# Patient Record
Sex: Female | Born: 1935 | Race: White | Hispanic: No | Smoking: Never smoker
Health system: Southern US, Community
[De-identification: ages and names within clinical notes are randomized; demographics above are authoritative.]

## PROBLEM LIST (undated history)

## (undated) ENCOUNTER — Emergency Department (HOSPITAL_COMMUNITY): Disposition: A | Discharge status: Home / Self Care | Payer: Medicare PPO

## (undated) DIAGNOSIS — M1990 Unspecified osteoarthritis, unspecified site: Secondary | ICD-10-CM

## (undated) DIAGNOSIS — F0280 Dementia in other diseases classified elsewhere without behavioral disturbance: Secondary | ICD-10-CM

## (undated) DIAGNOSIS — E039 Hypothyroidism, unspecified: Secondary | ICD-10-CM

## (undated) DIAGNOSIS — G309 Alzheimer's disease, unspecified: Secondary | ICD-10-CM

---

## 2000-06-08 ENCOUNTER — Encounter (INDEPENDENT_AMBULATORY_CARE_PROVIDER_SITE_OTHER): Payer: Self-pay | Admitting: Specialist

## 2000-06-08 ENCOUNTER — Other Ambulatory Visit: Admission: RE | Admit: 2000-06-08 | Discharge: 2000-06-08 | Payer: Self-pay | Admitting: *Deleted

## 2000-07-31 ENCOUNTER — Ambulatory Visit (HOSPITAL_COMMUNITY): Admission: RE | Admit: 2000-07-31 | Discharge: 2000-07-31 | Payer: Self-pay

## 2000-07-31 ENCOUNTER — Encounter (INDEPENDENT_AMBULATORY_CARE_PROVIDER_SITE_OTHER): Payer: Self-pay | Admitting: Specialist

## 2000-12-13 ENCOUNTER — Encounter: Payer: Self-pay | Admitting: *Deleted

## 2000-12-13 ENCOUNTER — Encounter (INDEPENDENT_AMBULATORY_CARE_PROVIDER_SITE_OTHER): Payer: Self-pay | Admitting: Specialist

## 2000-12-13 ENCOUNTER — Ambulatory Visit (HOSPITAL_COMMUNITY): Admission: RE | Admit: 2000-12-13 | Discharge: 2000-12-13 | Payer: Self-pay | Admitting: *Deleted

## 2001-03-06 ENCOUNTER — Encounter (INDEPENDENT_AMBULATORY_CARE_PROVIDER_SITE_OTHER): Payer: Self-pay | Admitting: Specialist

## 2001-03-06 ENCOUNTER — Ambulatory Visit: Admission: RE | Admit: 2001-03-06 | Discharge: 2001-03-06 | Payer: Self-pay | Admitting: Gynecology

## 2001-03-06 ENCOUNTER — Other Ambulatory Visit: Admission: RE | Admit: 2001-03-06 | Discharge: 2001-03-06 | Payer: Self-pay | Admitting: Gynecology

## 2001-03-19 ENCOUNTER — Encounter: Payer: Self-pay | Admitting: Gynecology

## 2001-03-21 ENCOUNTER — Inpatient Hospital Stay (HOSPITAL_COMMUNITY): Admission: RE | Admit: 2001-03-21 | Discharge: 2001-03-24 | Payer: Self-pay | Admitting: Gynecology

## 2001-03-21 ENCOUNTER — Encounter (INDEPENDENT_AMBULATORY_CARE_PROVIDER_SITE_OTHER): Payer: Self-pay | Admitting: Specialist

## 2001-03-28 ENCOUNTER — Ambulatory Visit: Admission: RE | Admit: 2001-03-28 | Discharge: 2001-03-28 | Payer: Self-pay | Admitting: Gynecology

## 2001-06-27 ENCOUNTER — Encounter (INDEPENDENT_AMBULATORY_CARE_PROVIDER_SITE_OTHER): Payer: Self-pay | Admitting: Specialist

## 2001-06-27 ENCOUNTER — Ambulatory Visit: Admission: RE | Admit: 2001-06-27 | Discharge: 2001-06-27 | Payer: Self-pay | Admitting: Gynecology

## 2001-06-27 ENCOUNTER — Other Ambulatory Visit: Admission: RE | Admit: 2001-06-27 | Discharge: 2001-06-27 | Payer: Self-pay | Admitting: Gynecology

## 2001-10-18 ENCOUNTER — Other Ambulatory Visit: Admission: RE | Admit: 2001-10-18 | Discharge: 2001-10-18 | Payer: Self-pay | Admitting: Obstetrics and Gynecology

## 2001-12-19 ENCOUNTER — Other Ambulatory Visit: Admission: RE | Admit: 2001-12-19 | Discharge: 2001-12-19 | Payer: Self-pay | Admitting: Gynecology

## 2001-12-19 ENCOUNTER — Ambulatory Visit: Admission: RE | Admit: 2001-12-19 | Discharge: 2001-12-19 | Payer: Self-pay | Admitting: Gynecology

## 2001-12-19 ENCOUNTER — Encounter (INDEPENDENT_AMBULATORY_CARE_PROVIDER_SITE_OTHER): Payer: Self-pay | Admitting: Specialist

## 2002-01-15 ENCOUNTER — Ambulatory Visit (HOSPITAL_COMMUNITY): Admission: RE | Admit: 2002-01-15 | Discharge: 2002-01-15 | Payer: Self-pay | Admitting: Gynecology

## 2002-01-15 ENCOUNTER — Encounter (INDEPENDENT_AMBULATORY_CARE_PROVIDER_SITE_OTHER): Payer: Self-pay | Admitting: *Deleted

## 2002-03-26 ENCOUNTER — Encounter (INDEPENDENT_AMBULATORY_CARE_PROVIDER_SITE_OTHER): Payer: Self-pay | Admitting: *Deleted

## 2002-03-26 ENCOUNTER — Ambulatory Visit: Admission: RE | Admit: 2002-03-26 | Discharge: 2002-03-26 | Payer: Self-pay | Admitting: Gynecology

## 2002-03-26 ENCOUNTER — Other Ambulatory Visit: Admission: RE | Admit: 2002-03-26 | Discharge: 2002-03-26 | Payer: Self-pay | Admitting: Gynecology

## 2002-06-26 ENCOUNTER — Ambulatory Visit: Admission: RE | Admit: 2002-06-26 | Discharge: 2002-06-26 | Payer: Self-pay | Admitting: Gynecology

## 2002-06-26 ENCOUNTER — Encounter (INDEPENDENT_AMBULATORY_CARE_PROVIDER_SITE_OTHER): Payer: Self-pay | Admitting: Specialist

## 2002-06-26 ENCOUNTER — Other Ambulatory Visit: Admission: RE | Admit: 2002-06-26 | Discharge: 2002-06-26 | Payer: Self-pay | Admitting: Gynecology

## 2003-08-05 ENCOUNTER — Ambulatory Visit: Admission: RE | Admit: 2003-08-05 | Discharge: 2003-08-05 | Payer: Self-pay | Admitting: Gynecology

## 2003-08-05 ENCOUNTER — Encounter (INDEPENDENT_AMBULATORY_CARE_PROVIDER_SITE_OTHER): Payer: Self-pay | Admitting: *Deleted

## 2003-08-05 ENCOUNTER — Other Ambulatory Visit: Admission: RE | Admit: 2003-08-05 | Discharge: 2003-08-05 | Payer: Self-pay | Admitting: Gynecology

## 2003-08-26 ENCOUNTER — Encounter: Payer: Self-pay | Admitting: Gynecology

## 2003-08-26 ENCOUNTER — Ambulatory Visit: Admission: RE | Admit: 2003-08-26 | Discharge: 2003-08-26 | Payer: Self-pay | Admitting: Gynecology

## 2003-08-26 ENCOUNTER — Encounter (INDEPENDENT_AMBULATORY_CARE_PROVIDER_SITE_OTHER): Payer: Self-pay | Admitting: Specialist

## 2003-09-11 ENCOUNTER — Ambulatory Visit (HOSPITAL_COMMUNITY): Admission: RE | Admit: 2003-09-11 | Discharge: 2003-09-11 | Payer: Self-pay | Admitting: Gynecology

## 2003-09-16 ENCOUNTER — Ambulatory Visit: Admission: RE | Admit: 2003-09-16 | Discharge: 2003-12-18 | Payer: Self-pay | Admitting: Radiation Oncology

## 2003-11-21 ENCOUNTER — Ambulatory Visit (HOSPITAL_COMMUNITY): Admission: RE | Admit: 2003-11-21 | Discharge: 2003-11-21 | Payer: Self-pay | Admitting: Gynecology

## 2003-11-26 ENCOUNTER — Ambulatory Visit (HOSPITAL_COMMUNITY): Admission: RE | Admit: 2003-11-26 | Discharge: 2003-11-26 | Payer: Self-pay | Admitting: Gynecology

## 2003-12-03 ENCOUNTER — Ambulatory Visit (HOSPITAL_COMMUNITY): Admission: RE | Admit: 2003-12-03 | Discharge: 2003-12-03 | Payer: Self-pay | Admitting: Gynecology

## 2003-12-12 ENCOUNTER — Ambulatory Visit (HOSPITAL_COMMUNITY): Admission: RE | Admit: 2003-12-12 | Discharge: 2003-12-12 | Payer: Self-pay | Admitting: Gynecology

## 2003-12-17 ENCOUNTER — Ambulatory Visit (HOSPITAL_COMMUNITY): Admission: RE | Admit: 2003-12-17 | Discharge: 2003-12-17 | Payer: Self-pay | Admitting: Gynecology

## 2004-01-07 ENCOUNTER — Encounter: Admission: RE | Admit: 2004-01-07 | Discharge: 2004-01-07 | Payer: Self-pay | Admitting: Geriatric Medicine

## 2004-01-20 ENCOUNTER — Ambulatory Visit: Admission: RE | Admit: 2004-01-20 | Discharge: 2004-01-20 | Payer: Self-pay | Admitting: Radiation Oncology

## 2004-02-03 ENCOUNTER — Ambulatory Visit: Admission: RE | Admit: 2004-02-03 | Discharge: 2004-02-03 | Payer: Self-pay | Admitting: Radiation Oncology

## 2004-02-10 ENCOUNTER — Ambulatory Visit: Admission: RE | Admit: 2004-02-10 | Discharge: 2004-02-10 | Payer: Self-pay | Admitting: Radiation Oncology

## 2004-04-27 ENCOUNTER — Encounter (INDEPENDENT_AMBULATORY_CARE_PROVIDER_SITE_OTHER): Payer: Self-pay | Admitting: *Deleted

## 2004-04-27 ENCOUNTER — Other Ambulatory Visit: Admission: RE | Admit: 2004-04-27 | Discharge: 2004-04-27 | Payer: Self-pay | Admitting: Gynecology

## 2004-04-28 ENCOUNTER — Ambulatory Visit: Admission: RE | Admit: 2004-04-28 | Discharge: 2004-04-28 | Payer: Self-pay | Admitting: Gynecology

## 2004-08-10 ENCOUNTER — Other Ambulatory Visit: Admission: RE | Admit: 2004-08-10 | Discharge: 2004-08-10 | Payer: Self-pay | Admitting: Radiation Oncology

## 2004-08-10 ENCOUNTER — Ambulatory Visit: Admission: RE | Admit: 2004-08-10 | Discharge: 2004-08-10 | Payer: Self-pay | Admitting: Radiation Oncology

## 2008-03-28 ENCOUNTER — Other Ambulatory Visit: Admission: RE | Admit: 2008-03-28 | Discharge: 2008-03-28 | Payer: Self-pay | Admitting: Geriatric Medicine

## 2008-06-25 ENCOUNTER — Emergency Department (HOSPITAL_COMMUNITY): Admission: EM | Admit: 2008-06-25 | Discharge: 2008-06-25 | Payer: Self-pay | Admitting: Emergency Medicine

## 2008-08-21 ENCOUNTER — Emergency Department (HOSPITAL_COMMUNITY): Admission: EM | Admit: 2008-08-21 | Discharge: 2008-08-21 | Payer: Self-pay | Admitting: Emergency Medicine

## 2008-08-22 ENCOUNTER — Emergency Department (HOSPITAL_COMMUNITY): Admission: EM | Admit: 2008-08-22 | Discharge: 2008-08-22 | Payer: Self-pay | Admitting: Emergency Medicine

## 2010-01-03 ENCOUNTER — Emergency Department (HOSPITAL_COMMUNITY): Admission: EM | Admit: 2010-01-03 | Discharge: 2010-01-03 | Payer: Self-pay | Admitting: Emergency Medicine

## 2010-06-20 IMAGING — CT CT HEAD W/O CM
1 series · 16 of 30 positions shown, 20 images · non-contrast
Comparison: MRI 01/07/2004.  No priors CT

CLINICAL DATA: Fell - trauma to right forehead

CT HEAD WITHOUT CONTRAST
TECHNIQUE: Contiguous axial images were obtained from the base of
the skull through the vertex without contrast.

[Series 2: headseq 4.8 h45s · axial · 0.43mm/px · z∈[-146,+6]mm · 16 of 36 slices shown, 20 images]
[im 2/36  brain]
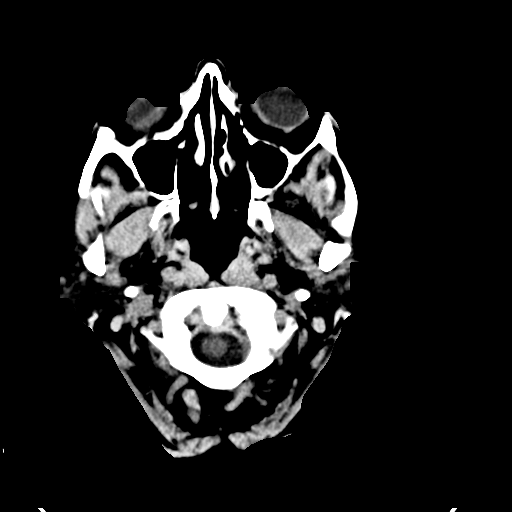
[im 2/36  bone]
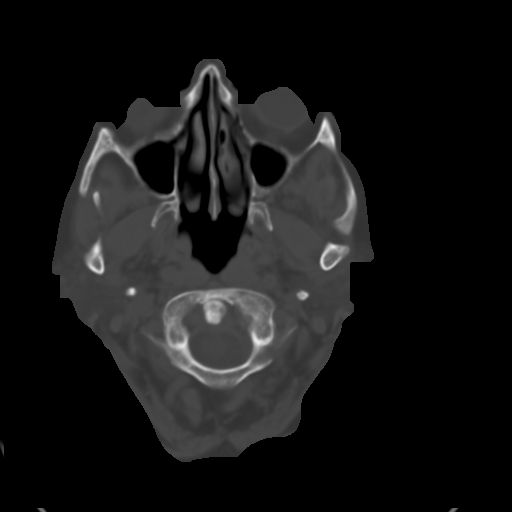
[im 4/36  brain]
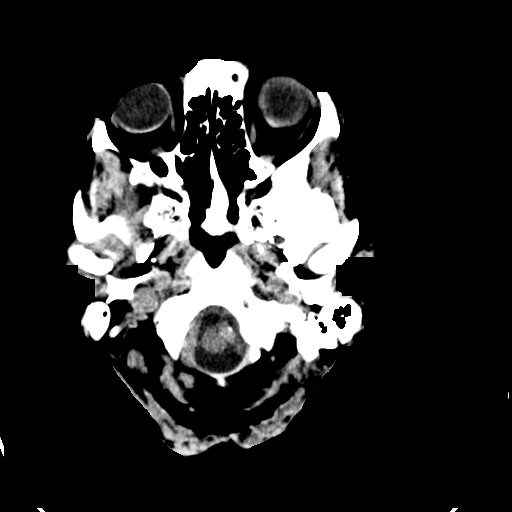
[im 7/36  brain]
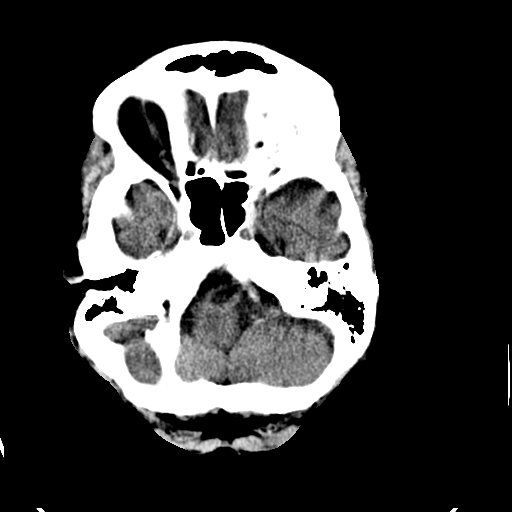
[im 9/36  brain]
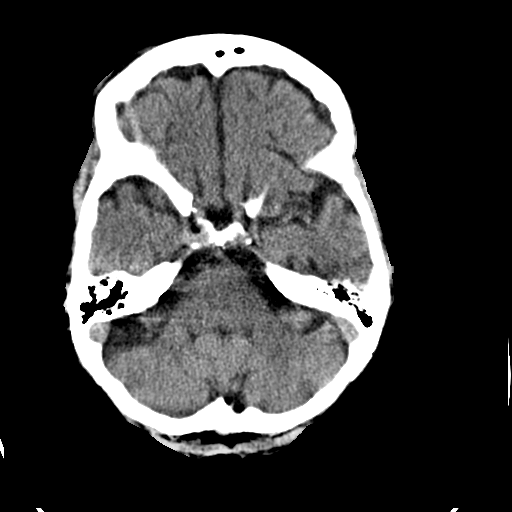
[im 10/36  brain]
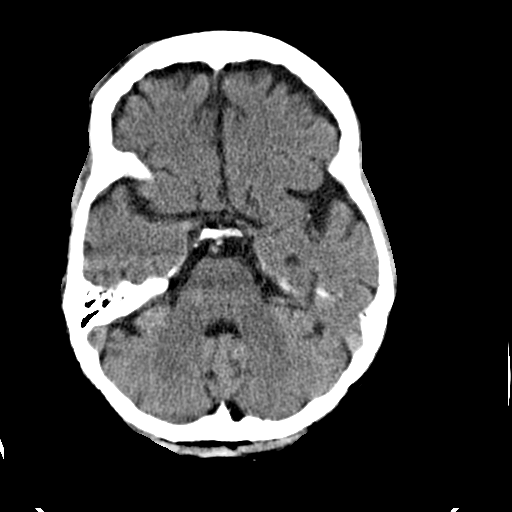
[im 10/36  bone]
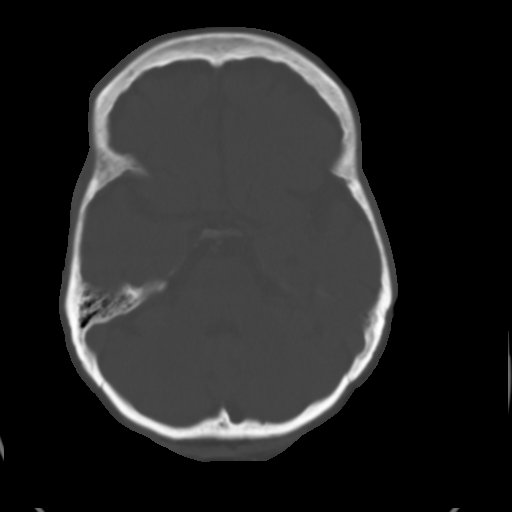
[im 13/36  brain]
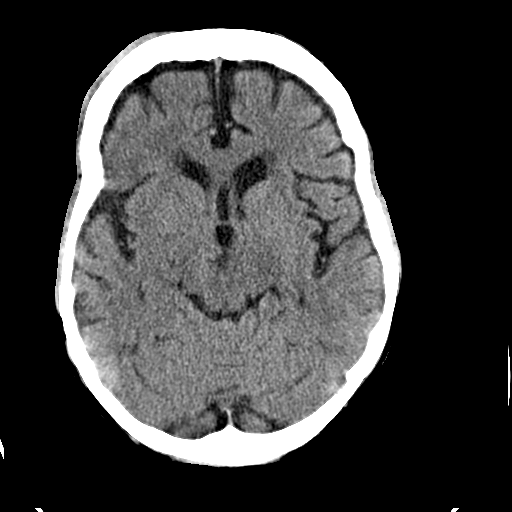
[im 15/36  brain]
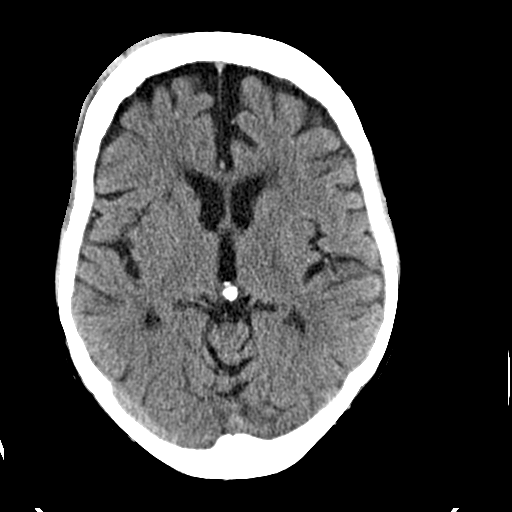
[im 17/36  brain]
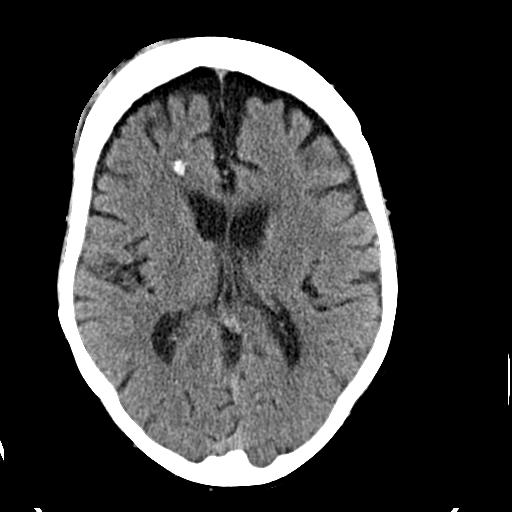
[im 19/36  brain]
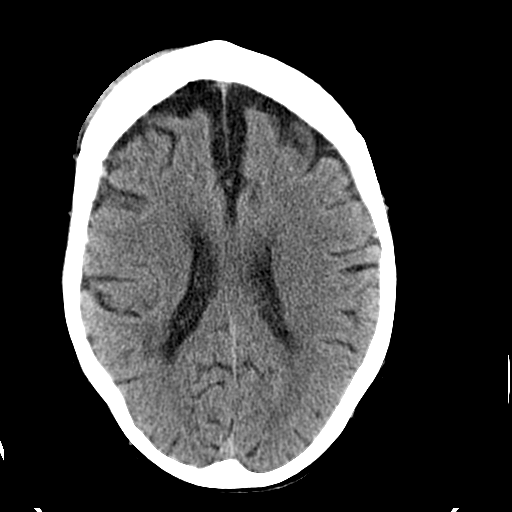
[im 19/36  bone]
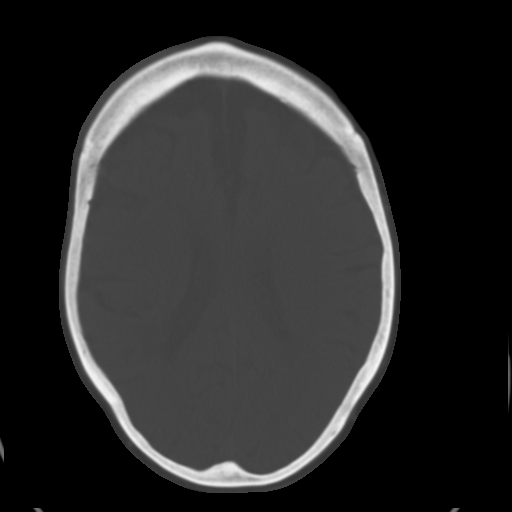
[im 21/36  brain]
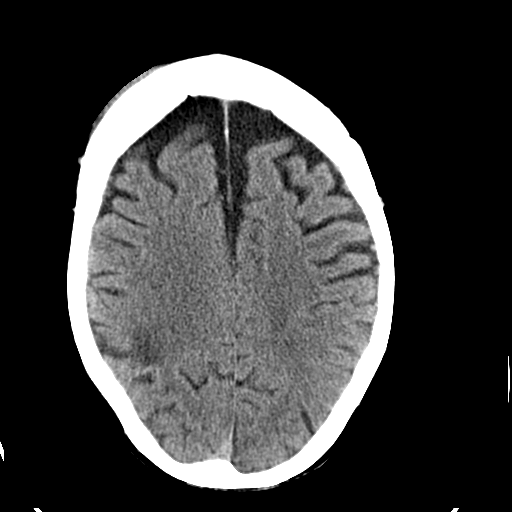
[im 23/36  brain]
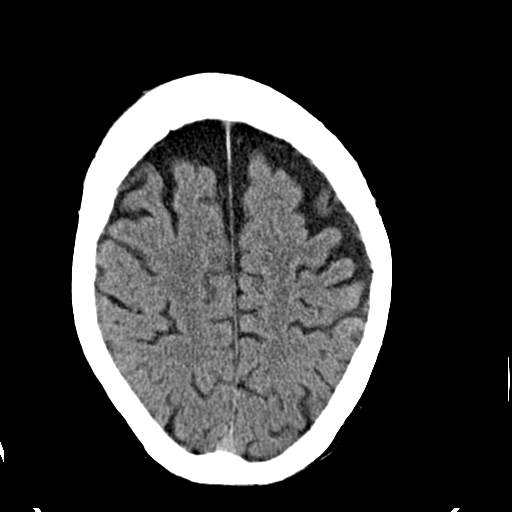
[im 26/36  brain]
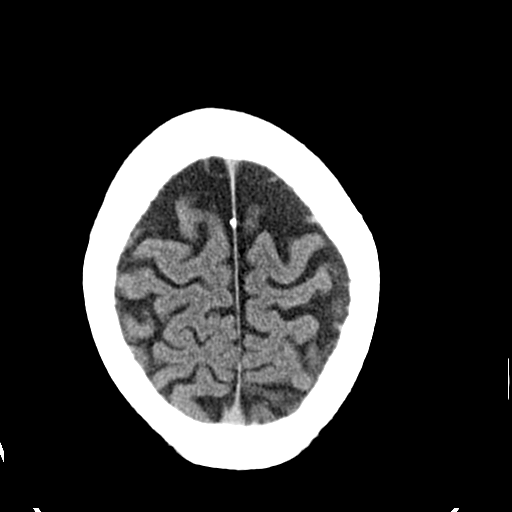
[im 27/36  brain]
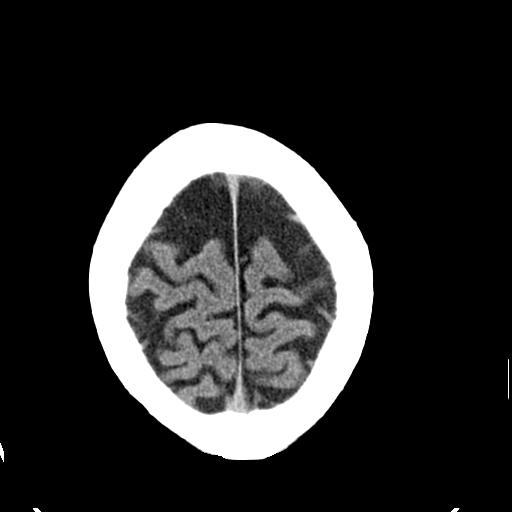
[im 27/36  bone]
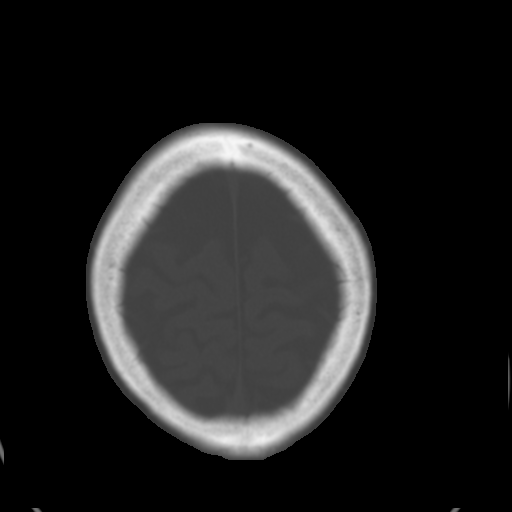
[im 29/36  brain]
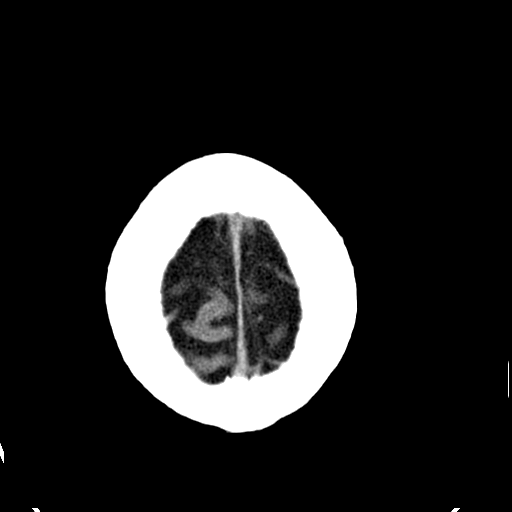
[im 32/36  brain]
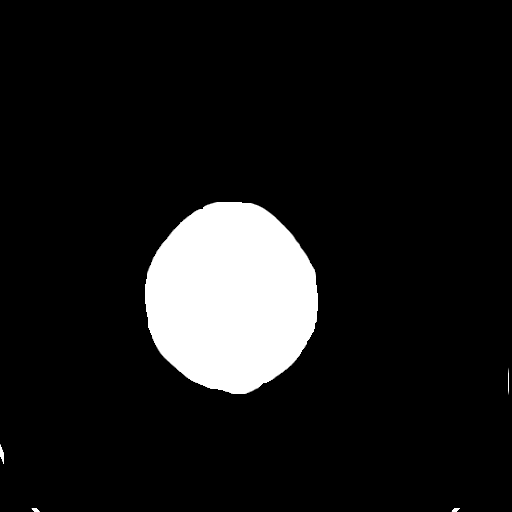
[im 34/36  brain]
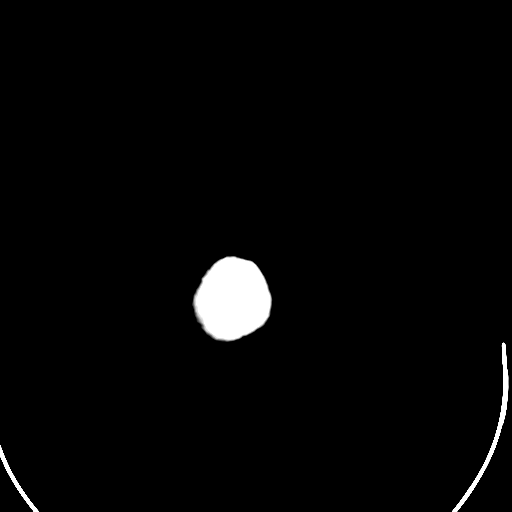

[16 of 30 positions shown; findings below may reference images not displayed]

FINDINGS: There is generalized atrophy.  In the white matter
surrounding the frontal horn of the right lateral ventricle, there
is a 6 mm high density focus with Hounsfield units in the 80s,
consistent with blood density.  This is consistent with a
hemorrhagic contusion.

There is a sub galeal hematoma in the right frontal scalp, with the
same Hounsfield unit measurement.  There is no edema or mass
effect.  No extra-axial fluid collections.

Calvarium intact.  No fluid in the sinuses visualized.
IMPRESSION: 1.  There is a small hemorrhagic contusion in the white matter
around the frontal horn of the right lateral ventricle. At this
time there is no edema or mass effect.
2.  Prominent sub galeal hematoma in the right frontal scalp.
3.  Generalized atrophy.

## 2010-08-19 ENCOUNTER — Emergency Department (HOSPITAL_COMMUNITY): Admission: EM | Admit: 2010-08-19 | Discharge: 2010-08-04 | Payer: Self-pay | Admitting: Emergency Medicine

## 2010-10-02 ENCOUNTER — Encounter: Payer: Self-pay | Admitting: Radiation Oncology

## 2010-11-23 LAB — URINALYSIS, ROUTINE W REFLEX MICROSCOPIC
Ketones, ur: NEGATIVE mg/dL
Nitrite: NEGATIVE
Specific Gravity, Urine: 1.018 (ref 1.005–1.030)
pH: 7 (ref 5.0–8.0)

## 2010-11-30 LAB — URINALYSIS, ROUTINE W REFLEX MICROSCOPIC
Bilirubin Urine: NEGATIVE
Nitrite: NEGATIVE
Specific Gravity, Urine: 1.013 (ref 1.005–1.030)
Urobilinogen, UA: 0.2 mg/dL (ref 0.0–1.0)

## 2011-01-28 NOTE — Consult Note (Signed)
[facility]  Patient:    [name], [name] [facility] Visit Number: [id number] MRN: [id number]          Service Type: GON Location: GYN Attending Physician:  Jeannette Corpus Dictated by:   Rande Brunt. Clarke-Pearson, M.D. Proc. Date: 06/27/01 Admit Date:  06/27/2001   CC:         Lafonda Mosses B. Thomasena Edis, M.D.             Hal T. Stoneking, M.D.             Telford Nab, R.N.                          Consultation Report  REASON FOR CONSULTATION:  [age]-year-old white female returns for continuing followup of a stage IB, grade 3 endometrial adenocarcinoma.  She underwent initial surgery in July of 2001.  Since her last visit, the patient has done well.  She denies any GI or GU symptoms, has no pelvic pain, pressure, vaginal bleeding or discharge.  Her main complaint is that of arthritis in her knees which limits her activity.  REVIEW OF SYSTEMS:  Review of systems reveals no cardiovascular, pulmonary, GI or GU symptoms.  FAMILY HISTORY AND SOCIAL HISTORY:  Reviewed and unchanged.  PHYSICAL EXAMINATION:  VITAL SIGNS:  Weight 165 pounds (stable).  Blood pressure 134/84.  GENERAL:  The patient is a healthy white female who appears younger than stated age.  HEENT:  Negative.  NECK:  Supple without thyromegaly.  NODES:  There is no supraclavicular or inguinal adenopathy.  ABDOMEN:  Soft and nontender.  No masses, organomegaly, ascites or herniae are noted.  Her midline incision is well-healed.  PELVIC:  EGBUS normal.  Vagina is clean and well-supported.  The vaginal cuff is normal.  There are no lesions noted.  Bimanual and rectovaginal exam reveal no masses, induration or nodularity.  EXTREMITIES:  The lower extremities have bilateral 1+ edema in the ankles.  IMPRESSION:  Stage IB, grade 3 endometrial adenocarcinoma.  PLAN:  Pap smears were obtained.  The patient will return to see Dr. Lafonda Mosses B. Collins in three months and return to see  [place] in six months. Dictated by:   Rande Brunt. Clarke-Pearson, M.D. Attending Physician:  Jeannette Corpus DD:  06/27/01 TD:  06/28/01 Job: [id number]

## 2011-01-28 NOTE — Op Note (Signed)
[facility]  Patient:    [name], [name]                  MRN: [id number] Proc. Date: 03/21/01 Adm. Date:  [id number] Disc. Date: [id number] Attending:  Jeannette Corpus CC:         Beather Arbour. Thomasena Edis, M.D.  Telford Nab, R.N.   Operative Report  PREOPERATIVE DIAGNOSIS:  Endometrial carcinoma.  POSTOPERATIVE DIAGNOSIS:  Endometrial carcinoma.  OPERATION: 1. Exploratory laparotomy. 2. Total abdominal hysterectomy. 3. Left salpingo-oophorectomy. 4. Pelvic lymphadenectomy. 5. Lysis of adhesions.  SURGEONS:  Daniel L. Clarke-Pearson, M.D.  ASSISTANT:  1. Beather Arbour Thomasena Edis, M.D.             2. Telford Nab, R.N.  ANESTHESIA:  General with orotracheal tube.  ESTIMATED BLOOD LOSS: 300 cc.  SURGICAL FINDINGS:  At exploratory laparotomy, there were dense adhesions of the omentum to the anterior abdominal wall underlying the prior midline incision.  Small bowel was adherent to the uterine fundus.  The uterus was slightly enlarged.  The left tube and ovary were normal.  The right tube and ovary were surgically absent.  Exploration of the upper abdomen was normal. There were no enlarged periaortic lymph nodes.  There was one enlarged right obturator lymph node which, on frozen section, returned as negative.  Frozen section revealed the uterus to contain a cancer which was invading the myometrial wall less than half way.  DESCRIPTION OF PROCEDURE:  The patient was brought to the operating room, and after satisfactory attainment of general anesthesia, was placed in the modified lithotomy position in Montross stirrups.  The anterior abdominal, perineum, and vagina were prepped with Betadine, Foley catheter was placed, and the patient was draped.  The abdomen was opened through a prior midline incision, the old scar being excised.  Peritoneal washings were obtained. Adhesions of the omentum to the anterior abdominal wall were lysed with  sharp and blunt dissection.  Hemostasis of the omentum was achieved with cautery.  A Buchwalter retractor was positioned, and the abdomen and pelvis explored with the above-noted findings.  Small-bowel adhesions were lysed from the uterus using sharp dissection.  The bowel was packed out of the pelvis.  The uterus was grasped with large Kelly clamps, and the retroperitoneal spaces were opened after dividing the round ligaments.  The ovarian vessels were skeletonized, clamped, cut, free tied, and suture ligated.  The paravesical and perirectal spaces were opened.  The bladder flap was advanced through sharp and blunt dissection.  The uterine vessels were skeletonized, clamped, cut, and suture ligated.  The rectovaginal septum was opened, and the remainder of the paracervical and paravaginal tissues were clamped, cut and suture ligated.  The vaginal angles cross clamped and the vagina transected from its junction with the cervix.   The uterus was submitted to frozen section with the above-noted findings.  The vaginal angles were transfixed using 0 Vicryl and the central portion of the vagina closed with interrupted figure-of-eight sutures of 0 Vicryl.  The palpable right obturator node was discovered.  Right pelvic lymphadenectomy of the external artery and vein and obturator space was performed.  It was noted that all of her retroperitoneal tissues were very friable, bled easily on contact.  In addition, the retroperitoneal arteries were very torturous.   A similar procedure was performed on the left side of the pelvis.  At that juncture, frozen section returned indicating that the enlarged lymph node was negative.  The pelvis was then  irrigated and inspected for hemostasis.  Packs and retractors were removed, and the anterior abdominal wall closed in layers, the first being a running Smead-Jones closure using #1 PDS.  Subcutaneous tissue was irrigated.  Hemostasis was achieved  with cautery, and the skin was closed with skin staples.  Dressing was applied. The patient was awakened from anesthesia and taken to the recovery room in satisfactory condition.  Sponge, needle, and instrument counts were correct x 2.    DD:  03/21/01 TD:  03/21/01 Job: [id number]

## 2011-01-28 NOTE — Op Note (Signed)
[facility]  Patient:    [name], [name]                  MRN: [id number] Proc. Date: 12/13/00 Adm. Date:  [id number] Attending:  Pleas Koch CC:         Hal T. Stoneking, M.D.                           Operative Report  PREOPERATIVE DIAGNOSES: 1. Postmenopausal bleeding. 2. Endometrial thickness.  POSTOPERATIVE DIAGNOSES: 1. Postmenopausal bleeding. 2. Endometrial thickness. 3. Inability to successfully locate endometrial cavity.  PROCEDURE:  Attempted dilatation and curettage.  Procedure terminated.  SURGEON:  Georgina Peer, M.D.  ANESTHESIA:  General.  ESTIMATED BLOOD LOSS:  20 cc.  COMPLICATIONS:  Inability to locate endometrial cavity even with ultrasound guidance and saline infusion.  INDICATIONS:  This is an 77 year old menopausal female with postmenopausal bleeding beginning in October 2001.  An endometrial biopsy in October revealed some atypical cells but no carcinoma.  A hysteroscopy was performed in November 2001, at which time an apparent thin endometrium was noted, and inactive endometrium and myometrium was returned on [facility] specimen.  The patient continued to have postmenopausal bleeding, and an ultrasound showed a thickened 2.4 cm endometrium.  An MRI confirmed this with the endometrial cavity, which deviated somewhat to the right, and the cervix was deviated to the right and anterior.  It was the plan to perform the D&C/hysteroscopy with ultrasound guidance to locate the endometrial cavity to avoid false channel creation.  The patient seems to understand risks and complications of the procedure, including the fact that the endometrial cavity may not be successfully located, and hysterectomy may be needed at a later date.  She was also aware of risks and complications of hysteroscopy, including bleeding, infection, uterine injury, cervical injury, perforation.  DESCRIPTION OF PROCEDURE:  The patient was taken to  the operating room, given a general anesthetic, placed in the dorsal lithotomy position.  The catheter was placed in her bladder, and the bladder was then instilled with approximately 300 cc of sterile saline.  With ultrasound present in the operating room, the uterine cavity was identified, and the endometrium was thickened.  The cervix was identified.  An endometrial curettage was performed, and then sounding of the upper cervix and lower uterine segment was performed under ultrasound guidance.  Several attempts to pass the uterine sound in different directions and different angles failed to achieve entry into the endometrial cavity.  A Kahn cannula was used to instill sterile saline to try and identify the endometrial cavity, and this was unsuccessful also.  After multiple attempts, it appeared that the sound was going posterior to the endometrial cavity.  It was thought by the surgeon that attempt at dilation with this information would increase the risk of inadequate sampling and uterine injury; therefore, the procedure was terminated at this time.  The instruments were removed and the patient awakened and taken to the recovery area.  Findings will be discussed with the patient and further options discussed with her, including hysterectomy. DD:  12/13/00 TD:  12/13/00 Job: [id number]

## 2011-01-28 NOTE — Consult Note (Signed)
[name], PREST ANN                   ACCOUNT NO.:  [id number]   MEDICAL RECORD NO.:  [id number]                   PATIENT TYPE:  OUT   LOCATION:  [facility]                                 FACILITY:  [facility]   PHYSICIAN:  De Blanch, M.D.         DATE OF BIRTH:  08-02-1915   DATE OF CONSULTATION:  08/26/2003  DATE OF DISCHARGE:                                   CONSULTATION   HISTORY:  An 77 year old white female returns for a continuing evaluation.  At her last visit on November 23 she had a Pap smear suggestive of recurrent  adenocarcinoma.  The patient is entirely asymptomatic.   PHYSICAL EXAMINATION:  VITAL SIGNS:  Weight 168 pounds, blood pressure  142/78.  PELVIC:  EGBUS is normal.  Vagina initially inspected is normal.  However,  on colposcopic examination there is a raised, slightly nodular and friable  lesion in the lower one-third of the vagina on the right lateral vaginal  wall.  This is very suspicious for recurrent cancer.  The remainder of the  vagina appears normal.  Biopsy of this lesion is obtained and hemostasis  achieved with silver nitrate.  Palpation of this area reveals a slightly  raised area approximately 0.75 cm in diameter.  No other nodularity or  masses are noted.  Rectovaginal examination confirms.   IMPRESSION:  Probable recurrent adenocarcinoma in the endometrium in the  distal vagina.  We will await biopsy reports.  If the biopsies do confirm  recurrent disease I would suggest the patient have a CT scan of the abdomen  and pelvis along with a chest x-ray.  If this appears to be localized  disease then I believe she would be best treated with radiation therapy and  we will make the appropriate referral.   I met with the patient and her son, Vinetta Bergamo, and discussed all of this with  them.  They are in agreement with our plan and we will follow up with them  as soon as the biopsy reports are back.      De Blanch, M.D.    DC/MEDQ  D:  08/26/2003  T:  08/26/2003  Job:  [id number]   cc:   Artist Pais, M.D.  [address], [address]  [place]  [place] [address]  Fax: [phone]   Hal T. Stoneking, M.D.  [address]  [place], [place] [address]  Fax: [phone]   Telford Nab, R.N.  [address]  [place], [place] [address]

## 2011-01-28 NOTE — Consult Note (Signed)
[facility]  Patient:    [name], [name]                  MRN: [id number] Proc. Date: 03/06/01 Adm. Date:  [id number] Attending:  Jeannette Corpus CC:         Beather Arbour. Thomasena Edis, M.D.  Hal T. Stoneking, M.D.  Telford Nab, R.N.   Consultation Report  REASON FOR CONSULTATION:  [age]-year-old white widowed female referred by Dr. Lafonda Mosses B. Collins for evaluation of abnormal uterine bleeding and endometrial mass.  HISTORY OF PRESENT ILLNESS:  The patient reports she has had bleeding pretty much continually since October of 2001.  On two occasions, once in November of 2001 and once in April of 2002, the patient underwent attempted hysteroscopy and D&C, although no significant amount of tissue was obtained.  She has continued to bleed and ultimately underwent an ultrasound on November 06, 2000 which showed an endometrial stripe measuring 2.4 cm in diameter.  The patient denies any pelvic pain or cramping and it is noted that she has had a hemoglobin that was 15, supporting the concept that her bleeding is not excessive but just persistent.  PAST MEDICAL HISTORY:  Medical illnesses:  Breast cancer, stage I; hypothyroidism; arthritis; osteoporosis.  PAST SURGICAL HISTORY:  Right mastectomy, 1992, treated with tamoxifen for five years; right salpingo-oophorectomy, 1950.  DRUG ALLERGIES:  None.  CURRENT MEDICATIONS: 1. Oxybutynin 5 mg daily (q.h.s.). 2. Caltrate. 3. Multivitamins. 4. Evista 60 mg daily. 5. Levothroid 0.1 mg daily.  FAMILY HISTORY:  Negative for colon, breast and ovarian and prostate cancer. Patients mother had vulvar cancer and had a son who died of probable testicular cancer.  SOCIAL HISTORY:  The patient is widowed.  She lives alone.  She is a retired Comptroller at [employer].  She does not smoke.  She drinks one glass of wine daily.  REVIEW OF SYSTEMS:  Essentially negative except for arthritic pain in  the knees.  PHYSICAL EXAMINATION:  VITAL SIGNS:  Height 5 feet 2 inches.  Weight 165 pounds.  GENERAL:  The patient is a healthy, elderly white female in no acute distress.  HEENT:  Negative.  NECK:  Supple without thyromegaly.  NODES:  There is no supraclavicular or inguinal adenopathy.  ABDOMEN:  Soft, nontender.  No mass, organomegaly, ascites or herniae are noted.  She has a well-healed low midline incision.  PELVIC:  EGBUS normal.  Vagina is clean.  The cervix is deviated slightly to the right, is easily visualized.  Bimanual exam reveals an anterior uterus, normal shape, size and consistency.  There are no adnexal masses noted. Rectovaginal exam confirms.  PROCEDURE NOTE:  After application of Hurricaine gel to the cervix, cervix is grasped with tenaculum and the uterus sounds to 7 cm anteriorly.  Pipelle biopsies are obtained using the Pipelle on two passes.  Pap smears also obtained.  IMPRESSION:  Postmenopausal bleeding with a thickened endometrial strip; rule out endometrial polyp; rule out endometrial hyperplasia; rule out endometrial cancer.  PLAN:  We will await the biopsy reports before making further recommendations. Depending upon that report, we may recommend a repeat D&C or potentially the patient might benefit most substantially from an abdominal hysterectomy and bilateral salpingo-oophorectomy for diagnosis and definitive treatment. DD:  03/06/01 TD:  03/07/01 Job: [id number]

## 2011-01-28 NOTE — Discharge Summary (Signed)
[facility]  Patient:    [name], [name] [facility]                MRN: [id number] Adm. Date:  [id number] Disc. Date: [id number] Attending:  Jeannette Corpus CC:         Hal T. Stoneking, M.D.  Daniel L. Clarke-Pearson, M.D.   Discharge Summary  HISTORY OF PRESENT ILLNESS:  The patient is an 77 year old Caucasian female admitted for total abdominal hysterectomy, left salpingo-oophorectomy, and possible staging operation with pelvic and/or periaortic lymphadenectomy, depending on invasion and intraoperative findings.  The patient manifested postmenopausal bleeding and subsequently had an endometrial biopsy which showed adenocarcinoma of the endometrium.  The patient thus admitted for the above-described procedure.  For further details, please see the dictated history and physical.  HOSPITAL COURSE:  The patient was admitted and underwent a TAH, LSO, and pelvic lymphadenectomy by Reuel Boom L. Clarke-Pearson, M.D.  Postoperatively, the patient was found to be doing well with stable blood pressures in the 130s/50s-80s.  She had excellent urine output.  She was using minimal analgesia from her PCA pump.  She was noted to be afebrile.  Lungs were clear and the cardiac exam revealed a regular rate and rhythm.  There was noted only to be minimal drainage on her dressing.  On postoperative day #1, the patient was without any complaints and had excellent urine output.  The temperature was 97.7 degrees.  She also had a normal blood pressure at 122/67 with other stable vital signs.  The abdomen was soft and nontender and the incision was noted to be clean and dry.  She did ambulate the night of surgery.  She continued to progress and was subsequently discharged home on postoperative day #3 after she was able to tolerate her diet.  She was found to have positive bowel sounds and positive flatus.  The incision was again clean and dry.  She did have a mild temperature  elevation of 100.5 degrees on March 23, 2001.  A follow-up CBC, as well as urinalysis was performed.  The urinalysis was negative.  The white count was noted to be negative as well.  The patient was asked to take her temperature twice daily and report temperatures greater than or equal to 101.5 degrees.  She had no evidence of infection on very careful examination.  FOLLOW-UP: She had been instructed to return to see Reuel Boom L. Clarke-Pearson, M.D., in one week for staple removal.  DISCHARGE MEDICATIONS:  She was given a prescription for Tylox, dispense 30, one to two p.o. q.3-4h. p.r.n. pain and also urged to take Colace twice daily, dispense 30, as a stool softener.  She was given some Ambien 5 mg, dispense 10, to take one p.o. q.h.s. p.r.n. insomnia as she stated in the hospital she was having difficulty sleeping.  DISCHARGE INSTRUCTIONS:  Extensive discharge instructions were given. DD:  04/13/01 TD:  04/15/01 Job: [id number]

## 2011-01-28 NOTE — Op Note (Signed)
[facility]  Patient:    [name], [name]                  MRN: [id number] Proc. Date: 07/31/00 Adm. Date:  [id number] Attending:  Pleas Koch CC:         Hal T. Pete Glatter, M.D., [facility]   Operative Report  PREOPERATIVE DIAGNOSES:       1. Postmenopausal bleeding.                               2. Atypical glands on endometrial biopsy.  POSTOPERATIVE DIAGNOSES:      1. Postmenopausal bleeding.                               2. Atypical glands on endometrial biopsy.                               3. Normal endometrial cavity.  OPERATION:                    Hysteroscopy, fractional dilatation & curettage.  SURGEON:                      Georgina Peer, M.D.  ANESTHESIA:                   IV sedation with paracervical block.  ESTIMATED BLOOD LOSS:         Less than 25 cc.  COMPLICATIONS:                None.  FINDINGS:                     Atrophic thick-appearing endometrium with no masses or other atypical vascularity.  INDICATIONS:                  This is an 77 year old gravida 4, para 3, with a brief episode of postmenopausal bleeding.  Evaluation revealed a thickened endometrium on ultrasound, and an endometrial biopsy revealed rare grouping of atypical glands, fragments, squamous metaplastic epithelium and histiocytes. Since suggestion for a further curettage specimen was made by the pathologist, the patient is brought in for this.  DESCRIPTION OF PROCEDURE:     The patient was taken to the operating room after informed consent, given IV sedation and placed in dorsal lithotomy position.  Perineum, vagina were prepped and draped in the normal sterile fashion and a red rubber catheter emptied her bladder.  The uterus was normal size anterior and mobile but no adnexal masses.  Speculum was placed in the vagina, and the cervix was grasped with a tenaculum after a paracervical block using 15 cc of 1% Xylocaine plain was  injected.  An endocervical curettage was performed before any sounding or dilation.  There was a minimum amount of tissue returned.  The uterus was sounded to 8 cm and then progressively dilated to 24 with Shawnie Pons dilators.  The hysteroscope was placed, and initially it appeared that a false channel was created with normal appearing endometrium or endometrial vasculature noted.  Redilation and repositioning slightly posterior to the original hysteroscopic positioning revealed a thin atrophic endometrium, normal thin vascularity of the endometrium.  Tubal ostia could not be seen. It was thought that this was the true endometrial  cavity because of the appearance of the vasculature and the smooth atrophic appearance of the endometrium.  There was no evidence of atypical vasculature.  No evidence of polyps, growths, or thickened tissue.  A sharp curettage was taken and sent to pathology.  Sponge, needle and instrument counts were correct.  Deficit hysteroscopically was 230 cc.  The patient was returned to the recovery area in stable condition. DD:  07/31/00 TD:  07/31/00 Job: [id number]

## 2011-01-28 NOTE — Consult Note (Signed)
[name], [name]                   ACCOUNT NO.:  [id number]   MEDICAL RECORD NO.:  [id number]                   PATIENT TYPE:  OUT   LOCATION:  GYN                                  FACILITY:  [facility]   PHYSICIAN:  De Blanch, M.D.         DATE OF BIRTH:  17-May-1915   DATE OF CONSULTATION:  04/28/2004  DATE OF DISCHARGE:                                   CONSULTATION   This 77 year old white female returns for continuing follow-up of recurrent  endometrial cancer.  She had a vault recurrence and has recently completed  radiation therapy with external beam therapy and high dose rates of  __________therapy to the vaginal vault.  All therapy was completed in April  2005.  The patient tolerated the therapy well.   In the interval she denies any pelvic pain, pressure, or any GI or GU  symptoms.  Her activity level is still very good.  She has been having some  difficulty with slight dementia and has recently been accepted for assisted  living arrangement.  She has no other medical problems that I can identify  in questioning.   PAST MEDICAL HISTORY:  Medical Illnesses:  1. Breast cancer.  2. Hypothyroidism.  3. Arthritis.  4. Osteoporosis.  5. Mild dementia.   PAST SURGICAL HISTORY:  1. Mastectomy.  2. Salpingo-oophorectomy.  3. Surgical staging for endometrial cancer.   FAMILY HISTORY:  Negative for gynecologic, breast, or colon cancer.   SOCIAL HISTORY:  Reviewed and unchanged.  She comes accompanied by her son.   PHYSICAL EXAMINATION:  VITAL SIGNS:  Weight 166 pounds, blood pressure  136/78.  GENERAL:  The patient is a healthy, elderly white female in no acute  distress.  HEENT:  Negative.  NECK:  Supple without thyromegaly.  There is no supraclavicular or inguinal  adenopathy.  ABDOMEN:  Soft and nontender.  No mass, organomegaly, ascites, or hernias  noted.  PELVIC:  EGBUS, vagina, bladder, and urethra are normal, but atrophic.  No  lesions are  noted on careful inspection.  Pap smears are obtained from the  vaginal vault.  Bimanual and rectovaginal exam reveal no masses, induration,  or nodularity.   IMPRESSION:  Recurrent endometrial cancer in the vaginal vault, now status  post radiation therapy with excellent response.   PLAN:  Pap smears are obtained.  The patient will return to see Dr. Roselind Messier  in three months and return to see [place] in six months.                                               De Blanch, M.D.    DC/MEDQ  D:  04/28/2004  T:  04/28/2004  Job:  [id number]   cc:   Hal T. Stoneking, M.D.  [address]  [place], [place] [address]  Fax: [phone]   Lafonda Mosses  Thomasena Edis, M.D.  [address]  [place]  [place] [address]  Fax: [phone]   Billie Lade, M.D.  [address] [facility]  [place]  [place] [address]  Fax: [phone]   Telford Nab, R.N.  [phone] [address]  [place], [place] [address]

## 2011-01-28 NOTE — Consult Note (Signed)
[id number]  Patient:    [name], [name] [facility]                MRN: [id number] Proc. Date: 03/28/01 Adm. Date:  [id number] Attending:  Jeannette Corpus CC:         Beather Arbour. Thomasena Edis, M.D.  Telford Nab, R.N.  Hal T. Stoneking, M.D.   Consultation Report  HISTORY OF PRESENT ILLNESS:  [age]-year-old white female who underwent exploratory laparotomy, total abdominal hysterectomy and bilateral salpingo-oophorectomy and pelvic lymphadenectomy on March 21, 2001.  She was found to have a stage IB, grade 3 endometrial adenocarcinoma.  Pelvic lymph nodes and washings were negative.  The invasion was only 5 mm out of 15-mm myometrium.  The patient had an uncomplicated postoperative course.  Staples are removed today, Steri-Strips applied.  The wound is dry, clean and no evidence of infection is noted.  IMPRESSION:  Stage IB, grade 3 endometrial cancer.  I discussed with the patient and her son the pathology report and the variety of prognostic features.  Given that there is no evidence that adjuvant therapy would improve her chances of survival, I would recommend that we follow her closely but would not, at this juncture, recommend any additional therapy.  We discussed the results of [facility] protocol 99, which demonstrated that radiation therapy reduced the risk of local recurrence but, in fact, had no significant impact on survival.  PLAN:  The patient will continue using stool softeners and laxatives as needed.  She will gradually increase her activity.  She will see Dr. Lafonda Mosses B. Collins for a six-week postoperative check.  I will plan on seeing her back again in three months for continuing cancer surveillance. DD:  03/28/01 TD:  03/29/01 Job: [id number]

## 2011-01-28 NOTE — Consult Note (Signed)
[name], [name]                   ACCOUNT NO.:  [id number]   MEDICAL RECORD NO.:  [id number]                   PATIENT TYPE:  OUT   LOCATION:  GYN                                  FACILITY:  [facility]   PHYSICIAN:  De Blanch, MD           DATE OF BIRTH:  09/15/1914   DATE OF CONSULTATION:  DATE OF DISCHARGE:  06/26/2002                                   CONSULTATION   HISTORY OF PRESENT ILLNESS:  The patient is an 77 year old white female who  returns for continuing follow-up of endometrial cancer. She initially  underwent a total abdominal hysterectomy, bilateral salpingo-oophorectomy,  and pelvic lymphoidectomy in July of 2002 and was found to have a stage I-B,  grade III endometrial carcinoma. She received no postoperative adjuvant  therapy. Subsequently in April of 2003 she had PAP smear suggesting  recurrence of her adenocarcinoma and we have evaluated her since then with  colposcopy and biopsy and have been unable to find any evidence of recurrent  disease. This has included examination under anesthesia on Jan 15, 2002 with  additional excisional vaginal biopsies showing simple chronic inflammation.  Since the last visit, the patient has done well. She denies any vaginal  bleeding or discharge or any other pelvic symptoms.   FAMILY HISTORY:  Noncontributory.   SOCIAL HISTORY:  Noncontributory.   REVIEW OF SYSTEMS:  Negative for any changes in her overall medical status.   PHYSICAL EXAMINATION:  VITAL SIGNS: Weight 162 pounds. Blood pressure  140/70.  GENERAL: Healthy, elderly, white female in no acute distress.  HEENT: Negative.  NECK: Supple without thyromegaly.  LYMPH: No supraclavicular, axillary, or inguinal adenopathy.  ABDOMEN: Soft and nontender. No masses, organomegaly, or hernias noted.  PELVIC: Vagina, bladder, and urethra essentially normal but atrophic. Cervix  and uterus surgically absent. Adnexa without masses.  RECTAL: Anus normal.  Stools guaiac negative.   DIAGNOSTIC STUDIES:  Colposcopic examination is performed of the entire  vagina once again. There is an area of slight thickening at the apex of the  vagina which is biopsied after application of Hurricane gel.   IMPRESSION:  Stage 1-B, grade III endometrial adenocarcinoma with PAP smears  suggesting atypical cells and possible recurrence. Vaginal biopsies pending  from today's visit. We will contact the patient with this report and make  further plans thereafter. A PAP smear is also obtained today.                                               De Blanch, MD    DC/MEDQ  D:  07/07/2002  T:  07/07/2002  Job:  [id number]   cc:   Telford Nab, R.N.  [address] [place], [place] [address]  Fax: [phone]   Artist Pais, MD  [address]  [place]  [place]  [address]  Fax: [phone]   Hal T. Stoneking, M.D.  [address] [place], [place] [address]  Fax: [phone]

## 2011-01-28 NOTE — Consult Note (Signed)
NAMEELLIS, KOFFLER ANN                   ACCOUNT NO.:  [id number]   MEDICAL RECORD NO.:  [id number]                   PATIENT TYPE:  OUT   LOCATION:  GYN                                  FACILITY:  [facility]   PHYSICIAN:  De Blanch, M.D.         DATE OF BIRTH:  01-Apr-1915   DATE OF CONSULTATION:  08/05/2003  DATE OF DISCHARGE:                                   CONSULTATION   An 77 year old white female returns for continuing follow-up of endometrial  cancer.   INTERVAL HISTORY:  Since her last visit the patient has done well.  She  denies any GI or GU symptoms.  Has no abdominal pain.  Denies any pelvic  pressure, vaginal bleeding, or discharge.  Her functional status is actually  good given her age.   HISTORY OF PRESENT ILLNESS:  The patient underwent total abdominal  hysterectomy, bilateral salpingo-oophorectomy, and pelvic lymphadenectomy  July of 2002 for a stage IB grade 3 endometrial carcinoma.  She did not  receive any adjuvant therapy.  Subsequently, in April 2003 she had a Pap  smear suggesting recurrent endometrial carcinoma, although further  evaluation including colposcopy and examination under anesthesia with  excisional biopsies revealed no evidence of recurrent disease.  She has been  followed since that time with no further problems.   CURRENT MEDICATIONS:  1. Synthroid.  2. Iron supplements.  3. Multivitamins.   ALLERGIES:  None.   PAST MEDICAL HISTORY:  1. Breast cancer stage I.  2. Hypothyroidism.  3. Arthritis.  4. Osteoporosis.   PAST SURGICAL HISTORY:  1. Mastectomy.  2. Salpingo-oophorectomy.  3. Surgical staging for endometrial cancer.   FAMILY HISTORY:  Negative for GYN, breast, or colon cancer.   SOCIAL HISTORY:  Reviewed and unchanged from previous notations.   REVIEW OF SYSTEMS:  Negative except as noted above.   PHYSICAL EXAMINATION:  VITAL SIGNS:  Weight 168 pounds.  GENERAL:  The patient is a healthy, elderly white  female in no acute  distress.  HEENT:  Negative.  NECK:  Supple without thyromegaly.  LYMPH:  There is no supraclavicular or inguinal adenopathy.  ABDOMEN:  Obese, soft, nontender.  No mass, organomegaly, ascites, or  hernias are noted.  PELVIC:  EGBUS, vagina, bladder, urethra are normal, but atrophic.  Cervix  and uterus are surgically absent.  No lesions are noted.  Bimanual and  rectovaginal examination reveal no masses, induration, or nodularity.  Pap  smears are obtained.   IMPRESSION:  Stage IB grade 3 endometrial adenocarcinoma July 2002 doing  well with no evidence of recurrent disease.  We will have the patient return  to see Artist Pais, M.D. in six months and return to see [place] in one year.  De Blanch, M.D.    DC/MEDQ  D:  08/06/2003  T:  08/06/2003  Job:  [id number]   cc:   Artist Pais, M.D.  [address]  [place]  [place] [address]  Fax: [phone]   Hal T. Stoneking, M.D.  [address]  [place], [place] [address]  Fax: [phone]   Telford Nab, R.N.  [address]  [place], [place] [address]

## 2011-01-28 NOTE — Consult Note (Signed)
[facility]  Patient:    [name], [name] [facility] Visit Number: [id number] MRN: [id number]          Service Type: GON Location: GYN Attending Physician:  Jeannette Corpus Dictated by:   Rande Brunt. Clarke-Pearson, M.D. Proc. Date: 03/27/02 Admit Date:  03/26/2002 Discharge Date: 03/26/2002   CC:         Lafonda Mosses B. Thomasena Edis, M.D.  Telford Nab, R.N.  Hal T. Stoneking, M.D.   Consultation Report  BRIEF HISTORY:  This 77 year old white female returns for continuing follow up of endometrial carcinoma.  She initially underwent abdominal hysterectomy and bilateral salpingo-oophorectomy and pelvic lymphadenectomy in July of 2002 and was found to have a Stage Ib, Grade 3, endometrial adenocarcinoma.  Pelvic nodes and washing were negative.  The patient received no postoperative therapy.  Subsequently she had a Pap smear in April 2003, suggesting adenocarcinoma. The vaginal biopsies were benign and the patient was taken to the operating room where she underwent EUA, and excisional biopsy of the vagina in Jan 15, 2002.  All of these biopsies also showed chronic and acute inflammation but no evidence of dysplasia although it is noted there were some focal areas of follicular cervicitis and denuded areas of acute and chronic inflammation.  The patient reports that since then she has done well.  She has no vaginal bleeding or discharge.  Her functional status has been excellent and she has no new symptoms.  FAMILY HISTORY AND SOCIAL HISTORY:  Reviewed and unchanged.  REVIEW OF SYSTEMS:  Also negative.  She has no new symptoms.  PHYSICAL EXAMINATION:  VITAL SIGNS:  Weight 170 pounds.  Blood pressure 142/74.  GENERAL:  The patient is a healthy elderly white female in no acute distress.  HEENT:  Negative.  NECK:  Supple without thyromegaly.  There is no supraclavicular or inguinal adenopathy.  ABDOMEN:  The abdomen is soft, nontender, no masses,  organomegaly, ascites or hernias noted.  PELVIC:  EG/BUS normal.   Vagina, bladder and urethra are normal.  The cervix and uterus are surgically absent.  Bimanual reveals no vaginal nodularity or any other abnormality.  IMPRESSION:  Stage Ib, Grade III endometrial carcinoma with a Pap smear suggesting adenocarcinoma.  Our work-up today has not revealed any further abnormality.  PLAN:  Pap smear is obtained today.  The patient will return in 3 months for continued surveillance or sooner if she develops symptoms. Dictated by:   Rande Brunt. Clarke-Pearson, M.D. Attending Physician:  Jeannette Corpus DD:  03/27/02 TD:  03/29/02 Job: [id number]

## 2011-01-28 NOTE — Op Note (Signed)
[facility]  Patient:    MARIPAZ, MULLAN [facility] Visit Number: [id number] MRN: [id number]          Service Type: DSU Location: DAY Attending Physician:  Jeannette Corpus Dictated by:   Rande Brunt. Clarke-Pearson, M.D. Proc. Date: 01/15/02 Admit Date:  01/15/2002   CC:         Lafonda Mosses B. Thomasena Edis, M.D.  Telford Nab, R.N.   Operative Report  PREOPERATIVE DIAGNOSIS:  Past history of endometrial carcinoma, now with abnormal Pap smears.  POSTOPERATIVE DIAGNOSIS:  Past history of endometrial carcinoma, now with abnormal Pap smears.  PROCEDURE:  Examination under anesthesia, excisional biopsies of vaginal lesions.  SURGEON:  Daniel L. Clarke-Pearson, M.D.  ASSISTANT:  Telford Nab, R.N.  ANESTHESIA:  MAC.  SURGICAL FINDINGS:  Examination under anesthesia revealed a small nodule at the right apex of the vagina and a roughened area on the right lower vagina. Both were excised in their entirety.  The remainder of the vagina appeared normal on several inspections.  DESCRIPTION OF PROCEDURE:  The patient is brought to the operating room and after satisfactory attainment of general anesthesia, the patient in lithotomy position in candy-cane stirrups.  The perineum and vagina were prepped with Betadine.  The patient was draped.  Using Deaver retractors, the vagina was inspected carefully.  A nodule in the upper vagina was grasped with an Allis clamp and using Metzenbaum scissors, this was excised, removing the mucosa. Figure-of-eight sutures were placed to achieve hemostasis.  Another lesion was noted in the right distal lateral vagina.  This was excised using Metzenbaum scissors.  Again, suture ligatures were required to achieve hemostasis.  The vagina was reinspected, and no other lesions were noted.  The patient was awakened from anesthesia, taken to the recovery room in satisfactory condition.  Sponge, needle, and instrument counts correct  x 2. Dictated by:   Rande Brunt. Clarke-Pearson, M.D. Attending Physician:  Jeannette Corpus DD:  01/15/02 TD:  01/15/02 Job: [id number]

## 2011-01-28 NOTE — Consult Note (Signed)
[facility]  Patient:    [name], [name] [facility] Visit Number: [id number] MRN: [id number]          Service Type: GON Location: GYN Attending Physician:  Jeannette Corpus Dictated by:   Rande Brunt. Clarke-Pearson, M.D. Proc. Date: 12/19/01 Admit Date:  12/19/2001   CC:         Lafonda Mosses B. Thomasena Edis, M.D.  Telford Nab, R.N.  Hal T. Stoneking, M.D.   Consultation Report  HISTORY OF PRESENT ILLNESS:  This 77 year old white female returns for evaluation of possible recurrent endometrial cancer.  I last saw the patient in October of 2002 at which time she was doing fine, had a normal exam and a normal Pap smear.  Subsequently, she was seen by Dr. Artist Pais who obtained a Pap smear showing cells consistent with recurrent adenocarcinoma. The patient has had biopsies on the vagina on February 24 which showed squamous mucosa with chronic inflammation.  A repeat biopsy on March 20 showed rare atypical epithelial cells which are suspicious for adenocarcinoma but this is not diagnostic.  The patient presents today for further evaluation. She reports she has had no symptoms.  She denies any pelvic pain, pressure, vaginal bleeding or discharge.  Her functional status is stable and excellent.  REVIEW OF SYSTEMS:  Unchanged from previous notations.  FAMILY/SOCIAL HISTORY:  Also unchanged.  She comes accompanied by her daughter today.  PHYSICAL EXAMINATION:  VITAL SIGNS:  Weight 167 pounds, blood pressure 142/84.  GENERAL:  The patient is a healthy elderly white female in no acute distress.  HEENT:  Negative.  NECK:  Supple without thyromegaly.  There is no supraclavicular or inguinal adenopathy.  ABDOMEN:  Soft and nontender.  No mass, organomegaly, ascites, or hernias are noted.  PELVIC:  EG/BUS is normal.  The vagina appears normal except for a slightly raised area on the right mid lateral vaginal wall.  The cuff is fine. Bimanual and  rectovaginal exam reveal no masses, induration, or nodularity.  PROCEDURE NOTE:  After applying Hurricaine gel, two biopsies are obtained from the right lateral vaginal wall lesion.  Hemostasis is achieved with Monsel solution and silver nitrate.  A Pap smear was also obtained prior to the biopsy.  IMPRESSION:  Stage IB, grade 3 endometrial carcinoma now with a possible vaginal recurrence based on recent Pap smear findings.  RECOMMENDATIONS:  Biopsies are pending as is a repeat Pap smear.  Will contact the patient with these results.  If she does have a vaginal recurrence, this seems to be relatively small and would seem to be amenable to intravaginal brachytherapy. Dictated by:   Rande Brunt. Clarke-Pearson, M.D. Attending Physician:  Jeannette Corpus DD:  12/19/01 TD:  12/19/01 Job: [id number]

## 2011-01-28 NOTE — H&P (Signed)
[facility]  Patient:    [name], [name]                  MRN: [id number] Adm. Date:  [id number] Disc. Date: [id number] Attending:  Jeannette Corpus CC:         Beather Arbour. Thomasena Edis, M.D.  Hal T. Stoneking, M.D.  Telford Nab, R.N.   History and Physical  INDICATIONS:  An 77 year old white female admitted for surgery for newly diagnosed endometrial cancer.  HISTORY OF PRESENT ILLNESS:  The patient has had vaginal bleeding since October 2001 and in November 2001 and April 2002 the patient has undergone attempted hysteroscopies and a D&C with no significant tissue obtained.  An ultrasound obtained in February 2002 showed an endometrial stripe measuring 2.4 cm.  The patient is referred to Dr. Stanford Breed on June 25, whereupon an endometrial biopsy was obtained showing an endometrial adenocarcinoma.  The patient denies any GI or GU symptoms and has had continued bleeding for the last several months but no obvious weight loss or other constitutional symptoms have been elicited.  The patient has a past history of a right salpingo-oophorectomy in 1950.  PAST MEDICAL HISTORY/MEDICAL ILLNESSES:  Breast cancer, stage I, in remission. Hypothyroidism, arthritis, osteoporosis.  PAST SURGICAL HISTORY:  Right mastectomy in 1992.  Right salpingo-oophorectomy in 1950.  ALLERGIES:  No drug allergies.  CURRENT MEDICATIONS: 1. Oxybutynin 5 mg q.h.s. 2. Caltrate. 3. Multivitamins. 4. Evista 60 mg daily. 5. Levothroid 0.1 mg daily.  FAMILY HISTORY:  Negative for breast, colon, ovarian, or prostate cancer.  SOCIAL HISTORY:  The patient is widowed.  She lives alone and is fully functional.  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  She does not smoke.  She drinks one glass of wine daily.  REVIEW OF SYSTEMS:  Negative except as noted in history of present illness.  PHYSICAL EXAMINATION:  VITAL SIGNS:  Height 5 feet 2 inches, weight 165  pounds, blood pressure 130/70.  GENERAL:  The patient is a healthy, elderly white female in no acute distress.  HEENT:  Negative.  NECK:  Supple without thyromegaly.  CHEST:  Clear to percussion and auscultation.  CARDIAC:  Normal without murmur or rub.  BREASTS:  She has a mastectomy site which is well healed and no skin lesions are noted.  ABDOMEN:  Soft and nontender.  No masses, organomegaly, ascites, or hernias are noted.  She has a below midline incision, which is healed.  PELVIC:  EGBUS is normal.  The vagina is clean except for some blood coming from the cervical os.  The cervical os is flush to the vaginal vault.  The uterus is upper limits of normal size.  There are no adnexal masses noted. Rectovaginal exam confirms.  Stool is guaiac negative.  IMPRESSION:  Endometrial adenocarcinoma.  PLAN:  The patient is admitted to undergo a total abdominal hysterectomy, bilateral salpingo-oophorectomy, and possible staging with pelvic and/or periaortic lymphadenectomy, depending on depth of invasion and intraoperative findings.  The risks of surgery including hemorrhage, infection, injury to adjacent viscera, thromboembolic complications, and anesthetic risks have been discussed with the patient and she wishes to proceed with surgery. Dr. Artist Pais will assist.  All of the patients questions are answered. DD:  03/21/01 TD:  03/21/01 Job: [id number]

## 2011-06-16 LAB — DIFFERENTIAL
Eosinophils Absolute: 0.1 10*3/uL (ref 0.0–0.7)
Eosinophils Relative: 1 % (ref 0–5)
Lymphs Abs: 1.1 10*3/uL (ref 0.7–4.0)
Monocytes Relative: 8 % (ref 3–12)

## 2011-06-16 LAB — URINALYSIS, ROUTINE W REFLEX MICROSCOPIC
Glucose, UA: NEGATIVE mg/dL
Ketones, ur: 15 mg/dL — AB
pH: 7 (ref 5.0–8.0)

## 2011-06-16 LAB — APTT: aPTT: 30 seconds (ref 24–37)

## 2011-06-16 LAB — CBC
HCT: 44.8 % (ref 36.0–46.0)
Hemoglobin: 14.9 g/dL (ref 12.0–15.0)
MCV: 95.7 fL (ref 78.0–100.0)
RBC: 4.68 MIL/uL (ref 3.87–5.11)
WBC: 11.5 10*3/uL — ABNORMAL HIGH (ref 4.0–10.5)

## 2011-06-16 LAB — BASIC METABOLIC PANEL
Chloride: 106 mEq/L (ref 96–112)
GFR calc Af Amer: >60 mL/min (ref >60)
Potassium: 3.7 mEq/L (ref 3.5–5.1)

## 2011-06-16 LAB — URINE CULTURE

## 2011-06-16 LAB — PROTIME-INR: INR: 1.1 (ref 0.00–1.49)

## 2014-07-22 ENCOUNTER — Emergency Department (HOSPITAL_COMMUNITY)
Admission: EM | Admit: 2014-07-22 | Discharge: 2014-07-22 | Disposition: A | Discharge status: Home / Self Care | Payer: Medicare PPO | Attending: Emergency Medicine | Admitting: Emergency Medicine

## 2014-07-22 ENCOUNTER — Encounter (HOSPITAL_COMMUNITY): Payer: Self-pay | Admitting: *Deleted

## 2014-07-22 DIAGNOSIS — S0990XA Unspecified injury of head, initial encounter: Secondary | ICD-10-CM | POA: Diagnosis not present

## 2014-07-22 DIAGNOSIS — W01198A Fall on same level from slipping, tripping and stumbling with subsequent striking against other object, initial encounter: Secondary | ICD-10-CM | POA: Insufficient documentation

## 2014-07-22 DIAGNOSIS — G309 Alzheimer's disease, unspecified: Secondary | ICD-10-CM | POA: Insufficient documentation

## 2014-07-22 DIAGNOSIS — Y998 Other external cause status: Secondary | ICD-10-CM | POA: Diagnosis not present

## 2014-07-22 DIAGNOSIS — Z8739 Personal history of other diseases of the musculoskeletal system and connective tissue: Secondary | ICD-10-CM | POA: Insufficient documentation

## 2014-07-22 DIAGNOSIS — W19XXXA Unspecified fall, initial encounter: Secondary | ICD-10-CM

## 2014-07-22 DIAGNOSIS — Y929 Unspecified place or not applicable: Secondary | ICD-10-CM | POA: Diagnosis not present

## 2014-07-22 DIAGNOSIS — Y939 Activity, unspecified: Secondary | ICD-10-CM | POA: Insufficient documentation

## 2014-07-22 DIAGNOSIS — F0280 Dementia in other diseases classified elsewhere without behavioral disturbance: Secondary | ICD-10-CM | POA: Insufficient documentation

## 2014-07-22 DIAGNOSIS — Z8639 Personal history of other endocrine, nutritional and metabolic disease: Secondary | ICD-10-CM | POA: Diagnosis not present

## 2014-07-22 HISTORY — DX: Dementia in other diseases classified elsewhere, unspecified severity, without behavioral disturbance, psychotic disturbance, mood disturbance, and anxiety: F02.80

## 2014-07-22 HISTORY — DX: Alzheimer's disease, unspecified: G30.9

## 2014-07-22 HISTORY — DX: Unspecified osteoarthritis, unspecified site: M19.90

## 2014-07-22 HISTORY — DX: Hypothyroidism, unspecified: E03.9

## 2014-07-22 NOTE — ED Notes (Signed)
Bed: WA14 Expected date:  Expected time:  Means of arrival:  Comments: ems 

## 2014-07-22 NOTE — Progress Notes (Signed)
  CARE MANAGEMENT ED NOTE 07/22/2014  [facility]   Account Number:  [id number]  Date Initiated:  07/22/2014  Documentation initiated by:  Radford PaxFERRERO,Quentez Lober  Subjective/Objective Assessment:   Patient presents to Ed post fall     Subjective/Objective Assessment Detail:   [facility], history of dementia, patient is nonverbal     Action/Plan:   Action/Plan Detail:   Anticipated DC Date:  07/22/2014     Status Recommendation to Physician:   Result of Recommendation:    Other ED Services  Consult Working Plan    DC Planning Services  Other  PCP issues    Choice offered to / List presented to:            Status of service:  Completed, signed off  ED Comments:   ED Comments Detail:  Per patient's chart, [facility] Dr. Darlina GuysJerry Powell.  System updated.

## 2014-07-22 NOTE — ED Notes (Signed)
rn called emergency contact, son, made son aware that pt was being discharged, he was in accordance. ptar called.

## 2014-07-22 NOTE — ED Notes (Addendum)
Per ems [facility], staff reports pt fell, found pt lying against wall. Questionable hematoma to back of head. Hx of dementia, unequal pupils. Staff reported current mental status is pts norm. Pt is nonverbal and non responsive. Family already and arrived and spoke with charge, they did not want pt transported to ED. Requested that ems be told to turn around. Family does not want any procedures done.

## 2014-07-22 NOTE — ED Notes (Signed)
Patient is non-verbal, but does not appear in any distress at this time Per report, patient's family did not want patient to come to ED and did not want EMS called Patient's family does not want any testing/procedures to be performed ED charge nurse is aware Will make ED providers aware

## 2014-07-22 NOTE — ED Provider Notes (Signed)
CSN: [id number]     Arrival date & time 07/22/14  1810 History   First MD Initiated Contact with Patient 07/22/14 1815     Chief Complaint  Patient presents with  . Fall     (Consider location/radiation/quality/duration/timing/severity/associated sxs/prior Treatment) HPI   [name] is a 80 y.o. femaleWho lives in a nursing care facility and fell, striking the back of her head.  The fall is not witnessed.  She is a DO NOT RESUSCITATE patient.  The family members tried to prevent emails from bringing her here, but the nursing facility set up was her policy, and sent her here.  Level V caveat- dementia   Past Medical History  Diagnosis Date  . Alzheimer's dementia   . Hypothyroid   . Osteoarthritis    History reviewed. No pertinent past surgical history. History reviewed. No pertinent family history. History  Substance Use Topics  . Smoking status: Never Smoker   . Smokeless tobacco: Not on file  . Alcohol Use: No   OB History    No data available     Review of Systems  Unable to perform ROS     Allergies  Review of patient's allergies indicates not on file.  Home Medications   Prior to Admission medications   Not on File   SpO2 98% Physical Exam  Constitutional: She appears well-developed.  Elderly, frail  HENT:  Head: Normocephalic.  Right Ear: External ear normal.  Left Ear: External ear normal.  Small contusion left posterior scalp.  No abrasion or laceration.  Eyes: Conjunctivae and EOM are normal.  Right pupil somewhat larger than left, but has a obvious cataract, present.  External ocular muscles are intact.  Neck: Normal range of motion and phonation normal. Neck supple.  Cardiovascular: Normal rate, regular rhythm and normal heart sounds.   Pulmonary/Chest: Effort normal and breath sounds normal. She exhibits no bony tenderness.  Abdominal: Soft. There is no tenderness.  Musculoskeletal: Normal range of motion.  Neurological: She is  alert. No cranial nerve deficit or sensory deficit. She exhibits normal muscle tone. Coordination normal.  nonverbal  Skin: Skin is warm, dry and intact.  Psychiatric: She has a normal mood and affect. Her behavior is normal.  Nursing note and vitals reviewed.   ED Course  Procedures (including critical care time)  18:20- family members arrived and stated that they do not want the patient to have additional evaluation.  They reiterate, that she is no CODE BLUE, and have previously chosen to not aggressively treat unless there are specific requirements or needs to improve her discomfort.  Labs Review Labs Reviewed - No data to display  Imaging Review No results found.   EKG Interpretation None      MDM   Final diagnoses:  Fall, initial encounter  Head injuries, initial encounter    Fall without evident serious injury.  Family wishes to not have her evaluated any further.   Nursing Notes Reviewed/ Care Coordinated Applicable Imaging Reviewed Interpretation of Laboratory Data incorporated into ED treatment  The patient appears reasonably screened and/or stabilized for discharge and I doubt any other medical condition or other [facility] requiring further screening, evaluation, or treatment in the ED at this time prior to discharge.  Plan: Home Medications- usual; Home Treatments- rest; return here if the recommended treatment, does not improve the symptoms; Recommended follow up- PCP prn   Flint MelterElliott L Stefen Juba, MD 07/23/14 [phone]

## 2014-07-22 NOTE — ED Notes (Signed)
PTAR called by ED Charge Nurse

## 2015-11-11 DEATH — deceased
